# Patient Record
Sex: Male | Born: 1975 | Race: White | Hispanic: No | Marital: Married | Smoking: Never smoker
Health system: Southern US, Community
[De-identification: ages and names within clinical notes are randomized; demographics above are authoritative.]

## PROBLEM LIST (undated history)

## (undated) HISTORY — PX: SHOULDER SURGERY: SHX246

---

## 2003-04-10 ENCOUNTER — Emergency Department (HOSPITAL_COMMUNITY): Admission: EM | Admit: 2003-04-10 | Discharge: 2003-04-10 | Payer: Self-pay | Admitting: Emergency Medicine

## 2003-04-10 ENCOUNTER — Encounter: Admission: RE | Admit: 2003-04-10 | Discharge: 2003-04-10 | Payer: Self-pay | Admitting: Emergency Medicine

## 2005-12-16 ENCOUNTER — Encounter: Admission: RE | Admit: 2005-12-16 | Discharge: 2005-12-16 | Payer: Self-pay | Admitting: Surgery

## 2005-12-16 ENCOUNTER — Ambulatory Visit (HOSPITAL_COMMUNITY): Admission: RE | Admit: 2005-12-16 | Discharge: 2005-12-17 | Payer: Self-pay | Admitting: Surgery

## 2016-05-07 ENCOUNTER — Emergency Department (HOSPITAL_COMMUNITY): Payer: Commercial Managed Care - PPO

## 2016-05-07 ENCOUNTER — Emergency Department (HOSPITAL_COMMUNITY)
Admission: EM | Admit: 2016-05-07 | Discharge: 2016-05-07 | Disposition: A | Discharge status: Home / Self Care | Payer: Commercial Managed Care - PPO | Attending: Emergency Medicine | Admitting: Emergency Medicine

## 2016-05-07 ENCOUNTER — Encounter (HOSPITAL_COMMUNITY): Payer: Self-pay | Admitting: Emergency Medicine

## 2016-05-07 DIAGNOSIS — M5441 Lumbago with sciatica, right side: Secondary | ICD-10-CM | POA: Diagnosis not present

## 2016-05-07 DIAGNOSIS — M5126 Other intervertebral disc displacement, lumbar region: Secondary | ICD-10-CM | POA: Diagnosis not present

## 2016-05-07 DIAGNOSIS — M5442 Lumbago with sciatica, left side: Secondary | ICD-10-CM | POA: Insufficient documentation

## 2016-05-07 DIAGNOSIS — M545 Low back pain, unspecified: Secondary | ICD-10-CM

## 2016-05-07 DIAGNOSIS — M5432 Sciatica, left side: Secondary | ICD-10-CM

## 2016-05-07 DIAGNOSIS — M5431 Sciatica, right side: Secondary | ICD-10-CM

## 2016-05-07 LAB — URINALYSIS, ROUTINE W REFLEX MICROSCOPIC
BACTERIA UA: NONE SEEN
BILIRUBIN URINE: NEGATIVE
Glucose, UA: NEGATIVE mg/dL
HGB URINE DIPSTICK: NEGATIVE
KETONES UR: NEGATIVE mg/dL
LEUKOCYTES UA: NEGATIVE
NITRITE: NEGATIVE
PROTEIN: 30 mg/dL — AB
Specific Gravity, Urine: 1.019 (ref 1.005–1.030)
pH: 5 (ref 5.0–8.0)

## 2016-05-07 LAB — BASIC METABOLIC PANEL
Anion gap: 11 (ref 5–15)
BUN: 16 mg/dL (ref 6–20)
CO2: 26 mmol/L (ref 22–32)
Calcium: 9.2 mg/dL (ref 8.9–10.3)
Chloride: 102 mmol/L (ref 101–111)
Creatinine, Ser: 0.98 mg/dL (ref 0.61–1.24)
GFR calc Af Amer: >60 mL/min (ref >60)
Glucose, Bld: 105 mg/dL — ABNORMAL HIGH (ref 65–99)
POTASSIUM: 3 mmol/L — AB (ref 3.5–5.1)
SODIUM: 139 mmol/L (ref 135–145)

## 2016-05-07 LAB — CBC WITH DIFFERENTIAL/PLATELET
BASOS ABS: 0 10*3/uL (ref 0.0–0.1)
Basophils Relative: 0 %
EOS ABS: 0.1 10*3/uL (ref 0.0–0.7)
EOS PCT: 1 %
HCT: 43.2 % (ref 39.0–52.0)
Hemoglobin: 14.8 g/dL (ref 13.0–17.0)
LYMPHS ABS: 1 10*3/uL (ref 0.7–4.0)
Lymphocytes Relative: 18 %
MCH: 28.8 pg (ref 26.0–34.0)
MCHC: 34.3 g/dL (ref 30.0–36.0)
MCV: 84 fL (ref 78.0–100.0)
Monocytes Absolute: 0.4 10*3/uL (ref 0.1–1.0)
Monocytes Relative: 7 %
Neutro Abs: 4.3 10*3/uL (ref 1.7–7.7)
Neutrophils Relative %: 74 %
PLATELETS: 251 10*3/uL (ref 150–400)
RBC: 5.14 MIL/uL (ref 4.22–5.81)
RDW: 13 % (ref 11.5–15.5)
WBC: 5.8 10*3/uL (ref 4.0–10.5)

## 2016-05-07 MED ORDER — POTASSIUM CHLORIDE CRYS ER 20 MEQ PO TBCR
40.0000 meq | EXTENDED_RELEASE_TABLET | Freq: Once | ORAL | Status: AC
  Administered 2016-05-07: 40 meq via ORAL
  Filled 2016-05-07: qty 2

## 2016-05-07 MED ORDER — KETOROLAC TROMETHAMINE 15 MG/ML IJ SOLN
15.0000 mg | Freq: Once | INTRAMUSCULAR | Status: AC
  Administered 2016-05-07: 15 mg via INTRAVENOUS
  Filled 2016-05-07: qty 1

## 2016-05-07 MED ORDER — HYDROCODONE-ACETAMINOPHEN 5-325 MG PO TABS
2.0000 | ORAL_TABLET | Freq: Once | ORAL | Status: AC
  Administered 2016-05-07: 2 via ORAL
  Filled 2016-05-07: qty 2

## 2016-05-07 MED ORDER — IBUPROFEN 600 MG PO TABS
600.0000 mg | ORAL_TABLET | Freq: Three times a day (TID) | ORAL | 0 refills | Status: AC
Start: 2016-05-07 — End: 2016-05-12

## 2016-05-07 MED ORDER — CYCLOBENZAPRINE HCL 10 MG PO TABS: 10.0000 mg | ORAL_TABLET | Freq: Three times a day (TID) | ORAL | 0 refills | Status: DC | PRN

## 2016-05-07 MED ORDER — GADOBENATE DIMEGLUMINE 529 MG/ML IV SOLN
20.0000 mL | Freq: Once | INTRAVENOUS | Status: AC
  Administered 2016-05-07: 20 mL via INTRAVENOUS

## 2016-05-07 MED ORDER — RANITIDINE HCL 150 MG PO TABS: 150.0000 mg | ORAL_TABLET | Freq: Two times a day (BID) | ORAL | 0 refills | Status: DC

## 2016-05-07 MED ORDER — HYDROCODONE-ACETAMINOPHEN 5-325 MG PO TABS
1.0000 | ORAL_TABLET | ORAL | 0 refills | Status: DC | PRN
Start: 2016-05-07 — End: 2021-11-27

## 2016-05-07 MED ORDER — DEXAMETHASONE SODIUM PHOSPHATE 10 MG/ML IJ SOLN
10.0000 mg | Freq: Once | INTRAMUSCULAR | Status: DC
  Filled 2016-05-07: qty 1

## 2016-05-07 MED ORDER — DEXAMETHASONE 4 MG PO TABS
10.0000 mg | ORAL_TABLET | Freq: Once | ORAL | Status: AC
  Administered 2016-05-07: 10 mg via ORAL
  Filled 2016-05-07: qty 3

## 2016-05-07 NOTE — ED Notes (Signed)
ED Provider at bedside. 

## 2016-05-07 NOTE — ED Notes (Signed)
Patient transported to MRI 

## 2016-05-07 NOTE — Discharge Instructions (Signed)
-   Drink plenty of fluids - No heavy lifting > 10 pounds for at least the next 1 week - Once your pain improves, use gentle stretching to improve you pain and help reduce stiffness - Heating pads can also help reduce your pain

## 2016-05-07 NOTE — ED Provider Notes (Signed)
[facility]-EMERGENCY DEPT Provider Note   CSN: [id number] Arrival date & time: 05/07/16  1621     History   Chief Complaint Chief Complaint  Patient presents with  . Back Pain  . Leg Pain    HPI Cody Hess is a 41 y.o. male.  HPI   Very pleasant 41 year old male here with back pain. Patient recently was bit by a cat one week ago. He was placed on Augmentin briefly and had multiple episodes of nausea and vomiting. He then developed an aching, throbbing midline lower back pain with associated numbness sensation along the back of his legs bilaterally. He was switched to doxycycline with resolution of his vomiting but has since had persistent worsening pain. Describes the pain as an aching, throbbing, primarily midline but also bilateral paraspinal pain. The pain radiates to the back of his legs bilaterally when he ambulates. He has associated numbness sensation along the back of his legs. Denies any dysuria or urinary or bowel incontinence. Denies any weakness of his legs. Denies any fevers, chills, or night sweats.   History reviewed. No pertinent past medical history.  There are no active problems to display for this patient.   Past Surgical History:  Procedure Laterality Date  . SHOULDER SURGERY         Home Medications    Prior to Admission medications   Medication Sig Start Date End Date Taking? Authorizing Provider  carvedilol (COREG) 6.25 MG tablet Take 6.25 mg by mouth daily.   Yes Historical Provider, MD  citalopram (CELEXA) 10 MG tablet Take 10 mg by mouth daily.   Yes Historical Provider, MD  naproxen sodium (ANAPROX) 220 MG tablet Take 220 mg by mouth daily as needed. For pain   Yes Historical Provider, MD  rosuvastatin (CRESTOR) 10 MG tablet Take 10 mg by mouth daily.   Yes Historical Provider, MD  cyclobenzaprine (FLEXERIL) 10 MG tablet Take 1 tablet (10 mg total) by mouth 3 (three) times daily as needed for muscle spasms. 05/07/16   Shaune Pollack, MD    HYDROcodone-acetaminophen (NORCO/VICODIN) 5-325 MG tablet Take 1-2 tablets by mouth every 4 (four) hours as needed. 05/07/16   Shaune Pollack, MD  ibuprofen (ADVIL,MOTRIN) 600 MG tablet Take 1 tablet (600 mg total) by mouth 3 (three) times daily. 05/07/16 05/12/16  Shaune Pollack, MD  ranitidine (ZANTAC) 150 MG tablet Take 1 tablet (150 mg total) by mouth 2 (two) times daily. 05/07/16 05/12/16  Shaune Pollack, MD    Family History No family history on file.  Social History Social History  Substance Use Topics  . Smoking status: Never Smoker  . Smokeless tobacco: Never Used  . Alcohol use Yes     Allergies   Augmentin [amoxicillin-pot clavulanate]   Review of Systems Review of Systems  Constitutional: Positive for fatigue. Negative for chills and fever.  HENT: Negative for congestion and rhinorrhea.   Eyes: Negative for visual disturbance.  Respiratory: Negative for cough, shortness of breath and wheezing.   Cardiovascular: Negative for chest pain and leg swelling.  Gastrointestinal: Negative for abdominal pain, diarrhea, nausea and vomiting.  Genitourinary: Negative for dysuria and flank pain.  Musculoskeletal: Positive for back pain. Negative for neck pain and neck stiffness.  Skin: Negative for rash and wound.  Allergic/Immunologic: Negative for immunocompromised state.  Neurological: Positive for numbness. Negative for syncope, weakness and headaches.  All other systems reviewed and are negative.    Physical Exam Updated Vital Signs BP 131/88 (BP Location: Right Arm)  Pulse 64   Temp 98.1 F (36.7 C) (Oral)   Resp 16   Ht 6\' 2"  (1.88 m)   Wt 200 lb (90.7 kg)   SpO2 95%   BMI 25.68 kg/m   Physical Exam  Constitutional: He is oriented to person, place, and time. He appears well-developed and well-nourished. No distress.  HENT:  Head: Normocephalic and atraumatic.  Eyes: Conjunctivae are normal.  Neck: Neck supple.  Cardiovascular: Normal rate, regular rhythm  and normal heart sounds.  Exam reveals no friction rub.   No murmur heard. Pulmonary/Chest: Effort normal and breath sounds normal. No respiratory distress. He has no wheezes. He has no rales.  Abdominal: He exhibits no distension.  Musculoskeletal: He exhibits no edema.  Neurological: He is alert and oriented to person, place, and time. He exhibits normal muscle tone.  Skin: Skin is warm. Capillary refill takes less than 2 seconds.  Psychiatric: He has a normal mood and affect.  Nursing note and vitals reviewed.   Spine Exam: Inspection/Palpation: Diffuse, significant TTP throughout midline and paraspinal lumbar spine. No midline deformity or erythema. Strength: 5/5 throughout LE bilaterally (hip flexion/extension, adduction/abduction; knee flexion/extension; foot dorsiflexion/plantarflexion, inversion/eversion; great toe inversion) Sensation: Intact to light touch in proximal and distal LE bilaterally Reflexes: 2+ quadriceps and achilles reflexes   ED Treatments / Results  Labs (all labs ordered are listed, but only abnormal results are displayed) Labs Reviewed  URINALYSIS, ROUTINE W REFLEX MICROSCOPIC - Abnormal; Notable for the following:       Result Value   Protein, ur 30 (*)    Squamous Epithelial / LPF 0-5 (*)    All other components within normal limits  BASIC METABOLIC PANEL - Abnormal; Notable for the following:    Potassium 3.0 (*)    Glucose, Bld 105 (*)    All other components within normal limits  CBC WITH DIFFERENTIAL/PLATELET    EKG  EKG Interpretation None       Radiology Mr Lumbar Spine W Wo Contrast  Result Date: 05/07/2016 CLINICAL DATA:  Initial evaluation for acute lower back pain. EXAM: MRI LUMBAR SPINE WITHOUT AND WITH CONTRAST TECHNIQUE: Multiplanar and multiecho pulse sequences of the lumbar spine were obtained without and with intravenous contrast. CONTRAST:  20mL MULTIHANCE GADOBENATE DIMEGLUMINE 529 MG/ML IV SOLN COMPARISON:  None. FINDINGS:  Segmentation: Normal segmentation. Lowest well-formed disc is labeled the L5-S1 level. Alignment: Vertebral bodies are normally aligned with preservation of the normal lumbar lordosis. No listhesis. Vertebrae: Vertebral body heights are well maintained. No evidence for acute or chronic fracture. Few small benign hemangiomas noted. No worrisome osseous lesion. Signal intensity within the vertebral body bone marrow is normal. No abnormal marrow edema. No abnormal enhancement. Conus medullaris: Extends to the L1 level and appears normal. Paraspinal and other soft tissues: Paraspinous soft tissues within normal limits. Visualized visceral structures unremarkable. Disc levels: L1-2:  Unremarkable. L2-3:  Unremarkable. L3-4: No disc bulge or disc protrusion. Mild facet hypertrophy. No stenosis. L4-5: Mild circumferential disc bulge with disc desiccation. Mild facet and ligamentum flavum hypertrophy. No significant canal stenosis. No significant foraminal encroachment. L5-S1:  Normal disc.  Minimal facet degeneration.  No stenosis. IMPRESSION: 1. Mild degenerative disc bulging at L4-5 without significant stenosis. 2. Mild facet degeneration at L3-4 through L5-S1. 3. Otherwise normal MRI of the lumbar spine. Electronically Signed   By: Rise MuBenjamin  McClintock M.D.   On: 05/07/2016 20:03    Procedures Procedures (including critical care time)  Medications Ordered in ED Medications  ketorolac (  TORADOL) 15 MG/ML injection 15 mg (15 mg Intravenous Given 05/07/16 1839)  HYDROcodone-acetaminophen (NORCO/VICODIN) 5-325 MG per tablet 2 tablet (2 tablets Oral Given 05/07/16 1838)  gadobenate dimeglumine (MULTIHANCE) injection 20 mL (20 mLs Intravenous Contrast Given 05/07/16 1931)  potassium chloride SA (K-DUR,KLOR-CON) CR tablet 40 mEq (40 mEq Oral Given 05/07/16 2054)  dexamethasone (DECADRON) tablet 10 mg (10 mg Oral Given 05/07/16 2055)     Initial Impression / Assessment and Plan / ED Course  I have reviewed the triage  vital signs and the nursing notes.  Pertinent labs & imaging results that were available during my care of the patient were reviewed by me and considered in my medical decision making (see chart for details).     41 yo M with PMHx as above here with severe back pain in setting of recent cat bite, followed by emesis after initiating Augmentin. Primary suspicion is herniated disc/lumbar stain secondary to forceful vomiting from ABX-induced gastritis, although given his recent cat bite, reported marked erythema or finger, must consider transient seeding of L-spine with discitis/osteo given his o/w lack of chronic back pain. Given his significant TTP on exam and sx c/w new onset sciatica, I do feel MRI emergently indicated to r/o osteo, discitis.   Fortunately, MR negative for acute abnormality. He has mild disc bulge which likely explains his sx. Otherwise, he is well appearing. WBC normal. No signs of infectious etiology. Will tx supportively, advise stretching/return to function, and d/c home.  Final Clinical Impressions(s) / ED Diagnoses   Final diagnoses:  Lower back pain  Bilateral sciatica  Herniated lumbar disc without myelopathy    New Prescriptions Discharge Medication List as of 05/07/2016  8:33 PM    START taking these medications   Details  cyclobenzaprine (FLEXERIL) 10 MG tablet Take 1 tablet (10 mg total) by mouth 3 (three) times daily as needed for muscle spasms., Starting Thu 05/07/2016, Print    HYDROcodone-acetaminophen (NORCO/VICODIN) 5-325 MG tablet Take 1-2 tablets by mouth every 4 (four) hours as needed., Starting Thu 05/07/2016, Print    ibuprofen (ADVIL,MOTRIN) 600 MG tablet Take 1 tablet (600 mg total) by mouth 3 (three) times daily., Starting Thu 05/07/2016, Until Tue 05/12/2016, Print    ranitidine (ZANTAC) 150 MG tablet Take 1 tablet (150 mg total) by mouth 2 (two) times daily., Starting Thu 05/07/2016, Until Tue 05/12/2016, Print         Shaune Pollack,  MD 05/08/16 1327

## 2016-05-07 NOTE — ED Triage Notes (Signed)
Pt. Stated, I was taking antibiotics for a cat bite, when I took the antibiotic it made me throw up.and while I was throwing up I must of hurt my back, but when [facility] went to [facility] they told me to come here cause the pain is going down my legs like their asleep and concerned about a kidney infection.

## 2016-05-07 NOTE — ED Notes (Signed)
Pt still in MRI 

## 2017-12-01 ENCOUNTER — Ambulatory Visit: Payer: Self-pay | Admitting: Family Medicine

## 2017-12-01 VITALS — BP 110/85 | HR 67 | Temp 97.6°F | Resp 20 | Wt 211.8 lb

## 2017-12-01 DIAGNOSIS — J329 Chronic sinusitis, unspecified: Secondary | ICD-10-CM

## 2017-12-01 MED ORDER — IPRATROPIUM BROMIDE 0.06 % NA SOLN
2.0000 | Freq: Three times a day (TID) | NASAL | 0 refills | Status: DC
Start: 2017-12-01 — End: 2021-11-27

## 2017-12-01 MED ORDER — DOXYCYCLINE HYCLATE 100 MG PO TABS: 100.0000 mg | ORAL_TABLET | Freq: Two times a day (BID) | ORAL | 0 refills | Status: DC

## 2017-12-01 NOTE — Patient Instructions (Signed)

## 2017-12-01 NOTE — Progress Notes (Signed)
Cody Hess is a 42 y.o. male who presents today with concerns of sinus congestion and facial pain for the last 2 weeks. Patient report using multiple medications over the counter to treat his symptoms to include pseudoephedrine and nasal sprays without relief of symptoms. He denies fever or known sick contacts at home or at work.  Review of Systems  Constitutional: Negative for chills, fever and malaise/fatigue.  HENT: Positive for congestion and sinus pain. Negative for ear discharge, ear pain and sore throat.   Eyes: Negative.   Respiratory: Negative for cough, sputum production and shortness of breath.   Cardiovascular: Negative.  Negative for chest pain.  Gastrointestinal: Negative for abdominal pain, diarrhea, nausea and vomiting.  Genitourinary: Negative for dysuria, frequency, hematuria and urgency.  Musculoskeletal: Negative for myalgias.  Skin: Negative.   Neurological: Negative for headaches.  Endo/Heme/Allergies: Negative.   Psychiatric/Behavioral: Negative.     O: Vitals:   12/01/17 1826  BP: 110/85  Pulse: 67  Resp: 20  Temp: 97.6 F (36.4 C)  SpO2: 98%     Physical Exam  Constitutional: He is oriented to person, place, and time. Vital signs are normal. He appears well-developed and well-nourished. He is active.  Non-toxic appearance. He does not have a sickly appearance.  HENT:  Head: Normocephalic.  Right Ear: Hearing, external ear and ear canal normal. Tympanic membrane is erythematous. A middle ear effusion is present.  Left Ear: Hearing, external ear and ear canal normal. Tympanic membrane is erythematous. A middle ear effusion is present.  Nose: Right sinus exhibits maxillary sinus tenderness and frontal sinus tenderness. Left sinus exhibits maxillary sinus tenderness and frontal sinus tenderness.  Mouth/Throat: Uvula is midline. Posterior oropharyngeal erythema present.  Neck: Normal range of motion. Neck supple.  Cardiovascular: Normal rate, regular  rhythm, normal heart sounds and normal pulses.  Pulmonary/Chest: Effort normal and breath sounds normal.  Abdominal: Soft. Bowel sounds are normal.  Musculoskeletal: Normal range of motion.  Lymphadenopathy:       Head (right side): No submental and no submandibular adenopathy present.       Head (left side): No submental and no submandibular adenopathy present.    He has no cervical adenopathy.  Neurological: He is alert and oriented to person, place, and time.  Psychiatric: He has a normal mood and affect.  Vitals reviewed.  A: 1. Sinusitis, unspecified chronicity, unspecified location    P: Discussed exam findings, diagnosis etiology and medication use and indications reviewed with patient. Follow- Up and discharge instructions provided. No emergent/urgent issues found on exam.  Patient verbalized understanding of information provided and agrees with plan of care (POC), all questions answered.  1. Sinusitis, unspecified chronicity, unspecified location - ipratropium (ATROVENT) 0.06 % nasal spray; Place 2 sprays into both nostrils 3 (three) times daily. - doxycycline (VIBRA-TABS) 100 MG tablet; Take 1 tablet (100 mg total) by mouth 2 (two) times daily.

## 2018-08-18 IMAGING — MR MR LUMBAR SPINE WO/W CM
4 of 7 series · 19 of 48 positions shown · IV contrast (multihance)
Comparison: None.

CLINICAL DATA: Initial evaluation for acute lower back pain.

EXAM:
MRI LUMBAR SPINE WITHOUT AND WITH CONTRAST
TECHNIQUE: Multiplanar and multiecho pulse sequences of the lumbar spine were
obtained without and with intravenous contrast.
CONTRAST:  20mL MULTIHANCE GADOBENATE DIMEGLUMINE 529 MG/ML IV SOLN

[Series 2: T1 · sagittal · 4.0mm · 0.51mm/px · 3 of 13 slices shown (1 of 2)]
[im 1/13]
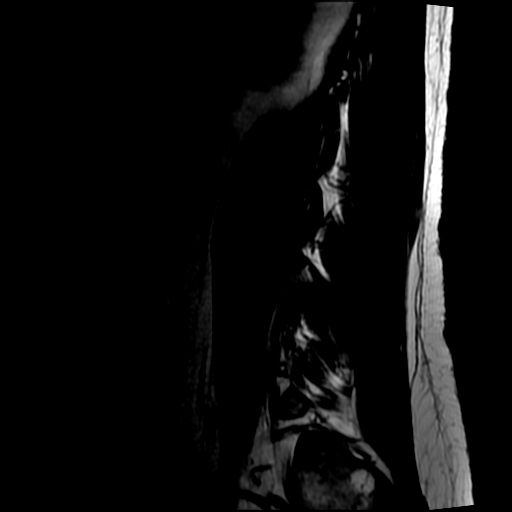
[im 7/13]
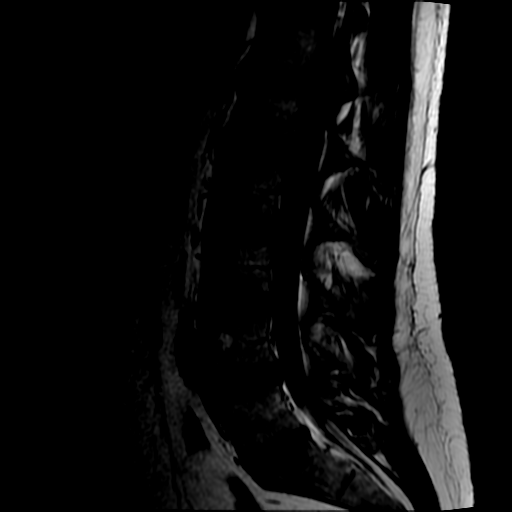
[im 13/13]
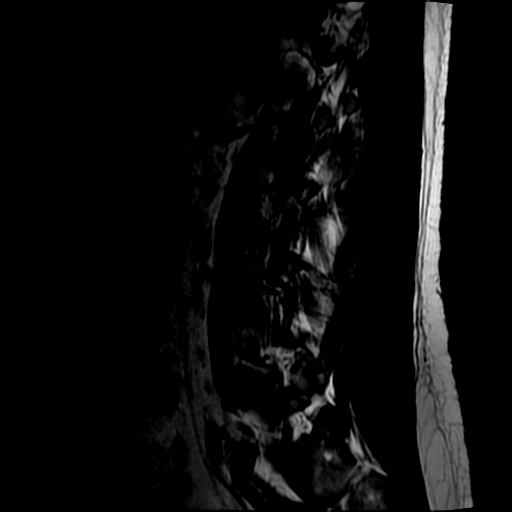

[Series 4: T2 · axial · 4.0mm · 0.39mm/px · z∈[-95,+88]mm · 10 of 37 slices shown (1 of 2)]
[im 1/37]
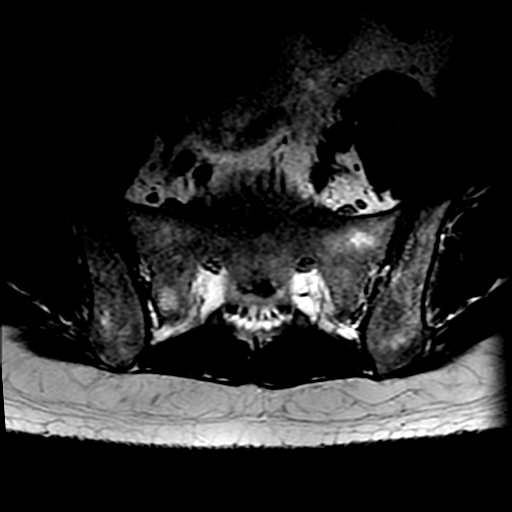
[im 4/37]
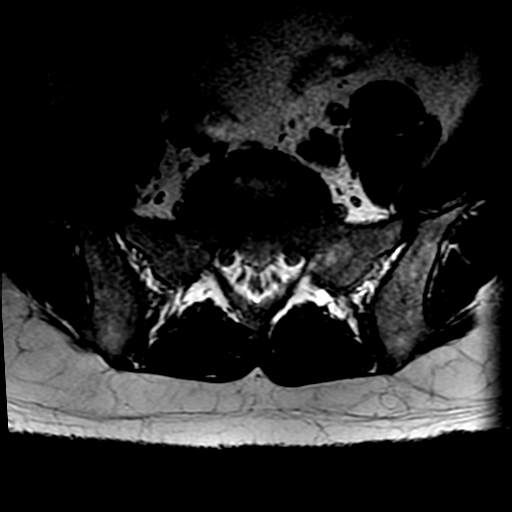
[im 8/37]
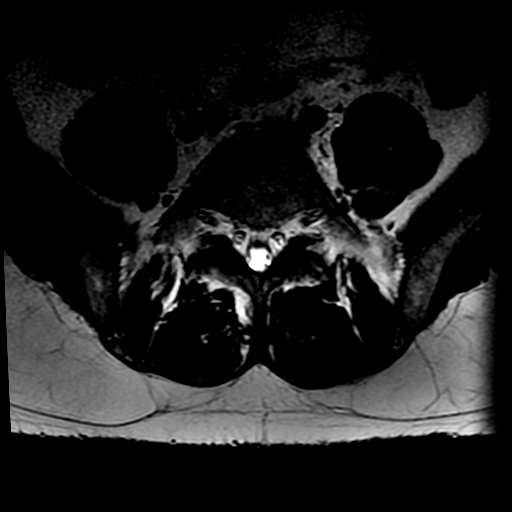
[im 11/37]
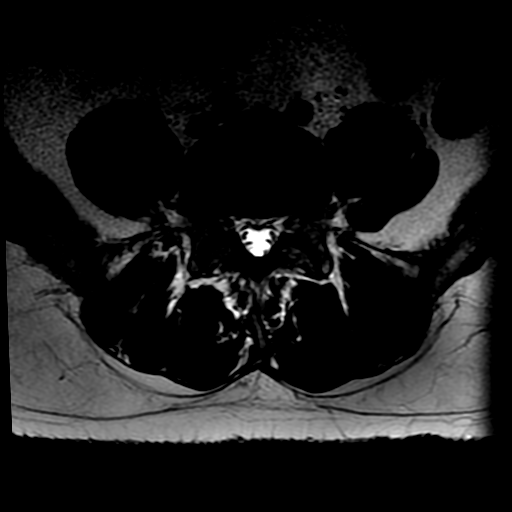
[im 15/37]
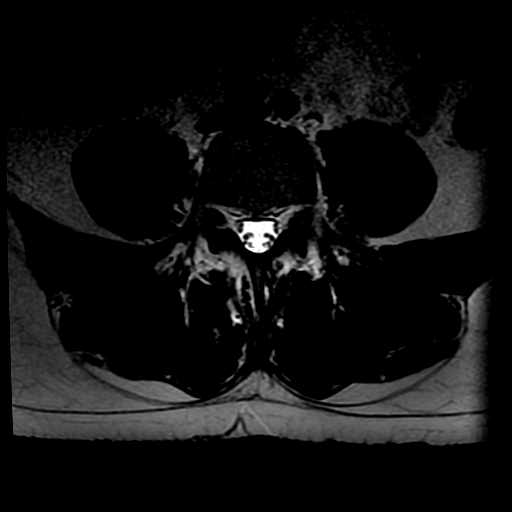
[im 19/37]
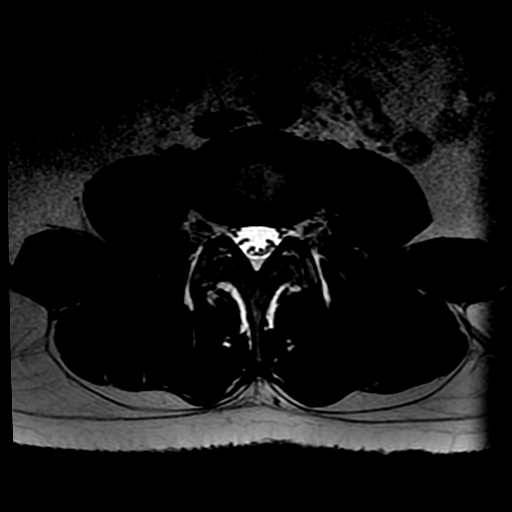
[im 22/37]
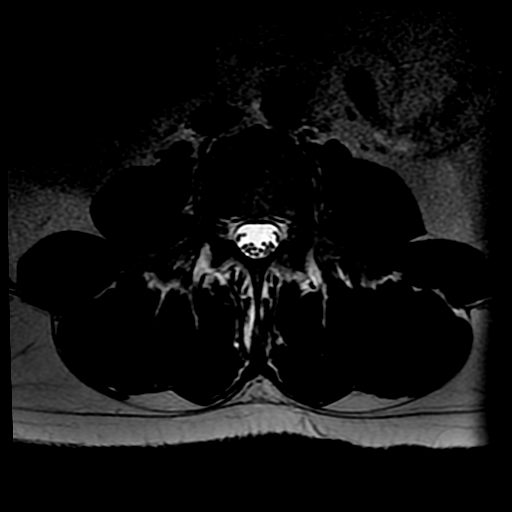
[im 26/37]
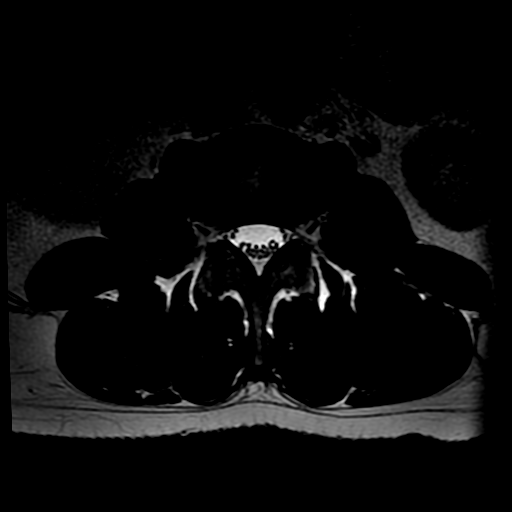
[im 29/37]
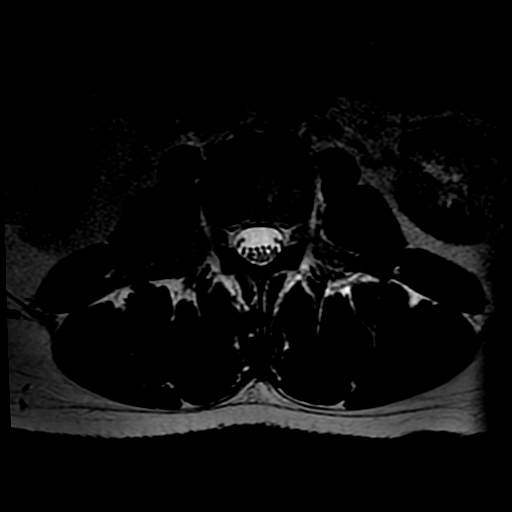
[im 33/37]
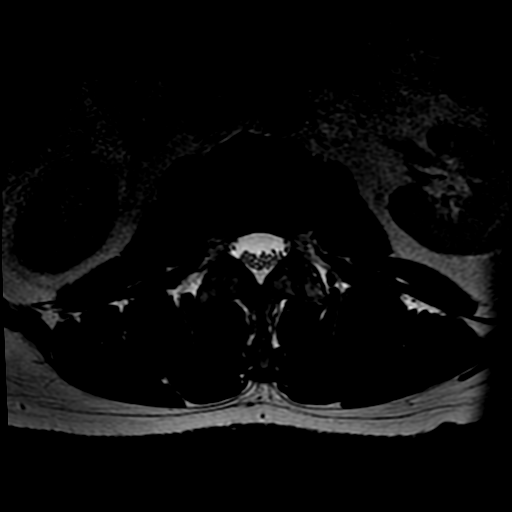

[Series 5: T1 · axial · 4.0mm · 0.39mm/px · z∈[-80,+88]mm · 3 of 37 slices shown (2 of 2)]
[im 4/37]
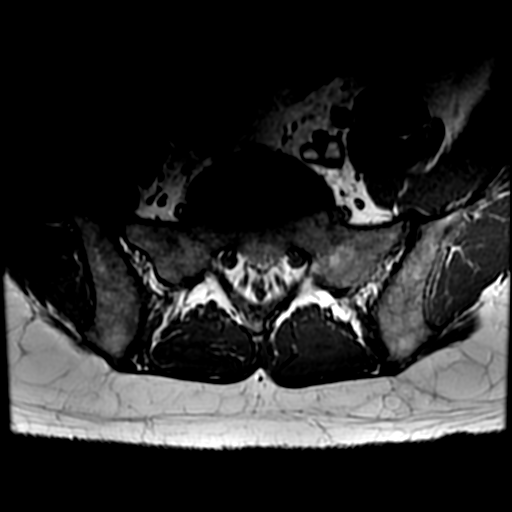
[im 19/37]
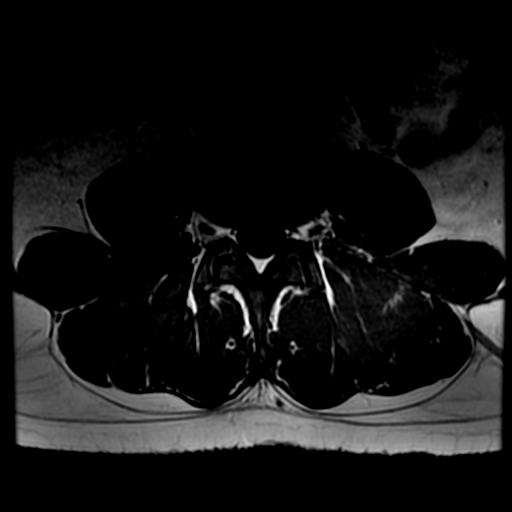
[im 33/37]
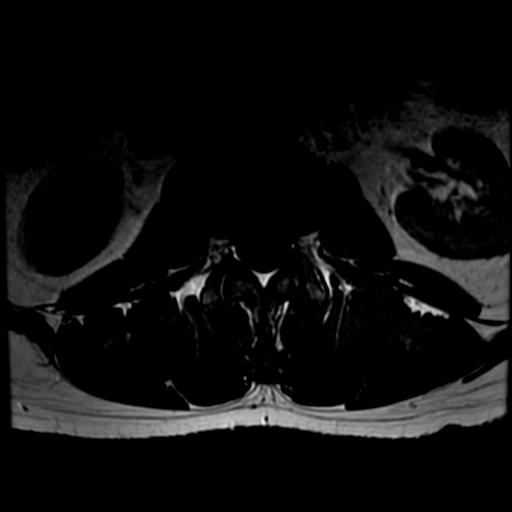

[Series 6: T2 · sagittal · 4.0mm · 0.51mm/px · 3 of 14 slices shown (2 of 2)]
[im 1/14]
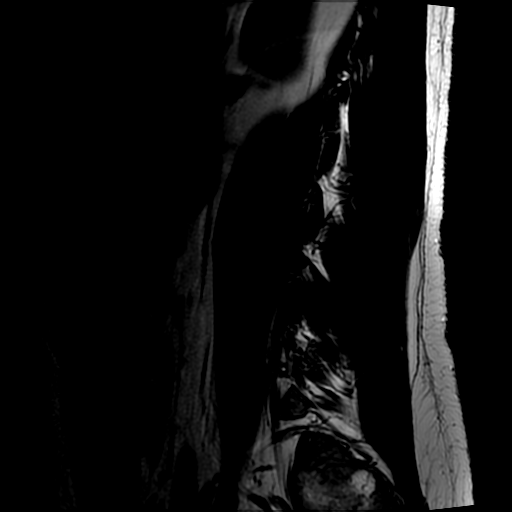
[im 9/14]
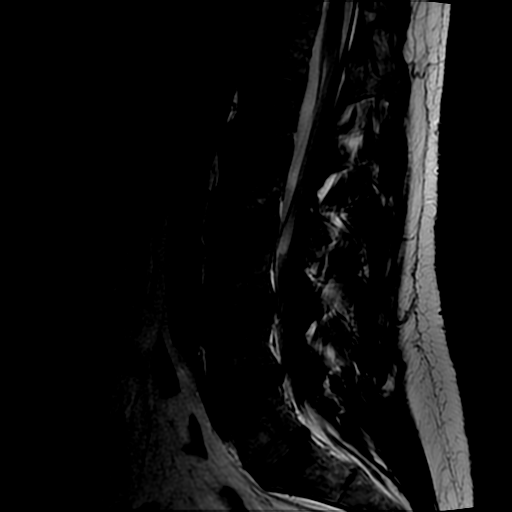
[im 14/14]
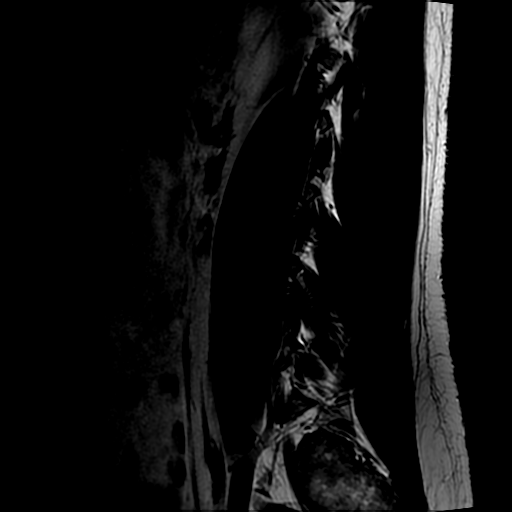

[19 of 48 positions shown; findings below may reference images not displayed]

FINDINGS: Segmentation: Normal segmentation. Lowest well-formed disc is
labeled the L5-S1 level.

Alignment: Vertebral bodies are normally aligned with preservation
of the normal lumbar lordosis. No listhesis.

Vertebrae: Vertebral body heights are well maintained. No evidence
for acute or chronic fracture. Few small benign hemangiomas noted.
No worrisome osseous lesion. Signal intensity within the vertebral
body bone marrow is normal. No abnormal marrow edema. No abnormal
enhancement.

Conus medullaris: Extends to the L1 level and appears normal.

Paraspinal and other soft tissues: Paraspinous soft tissues within
normal limits. Visualized visceral structures unremarkable.

Disc levels:

L1-2:  Unremarkable.

L2-3:  Unremarkable.

L3-4: No disc bulge or disc protrusion. Mild facet hypertrophy. No
stenosis.

L4-5: Mild circumferential disc bulge with disc desiccation. Mild
facet and ligamentum flavum hypertrophy. No significant canal
stenosis. No significant foraminal encroachment.

L5-S1:  Normal disc.  Minimal facet degeneration.  No stenosis.
IMPRESSION: 1. Mild degenerative disc bulging at L4-5 without significant
stenosis.
2. Mild facet degeneration at L3-4 through L5-S1.
3. Otherwise normal MRI of the lumbar spine.

## 2019-01-19 ENCOUNTER — Other Ambulatory Visit: Payer: Self-pay

## 2019-01-19 DIAGNOSIS — Z20822 Contact with and (suspected) exposure to covid-19: Secondary | ICD-10-CM

## 2019-01-22 LAB — NOVEL CORONAVIRUS, NAA: SARS-CoV-2, NAA: NOT DETECTED

## 2020-01-08 ENCOUNTER — Ambulatory Visit: Payer: Self-pay | Attending: Internal Medicine

## 2020-01-08 DIAGNOSIS — Z23 Encounter for immunization: Secondary | ICD-10-CM

## 2020-01-08 NOTE — Progress Notes (Signed)
   [facility]ov[facility] Vaccination [facility]  Name:  Cody Hess    MRN: [id number] DOB: 05/14/1975  01/08/2020  Mr. Cody Hess was observed post Covid-19 immunization for 30 minutes based on pre-vaccination screening without incident. He was provided with Vaccine Information Sheet and instruction to access the V-Safe system.   Mr. Cody Hess was instructed to call 911 with any severe reactions post vaccine: Marland Kitchen Difficulty breathing  . Swelling of face and throat  . A fast heartbeat  . A bad rash all over body  . Dizziness and weakness   Immunizations Administered    Name Date Dose VIS Date Route   JANSSEN COVID-19 VACCINE 01/08/2020  3:31 PM 0.5 mL 12/06/2019 Intramuscular   Manufacturer: [employer]   Lot: [id number]   NDC: 20947-096-28

## 2021-11-27 ENCOUNTER — Ambulatory Visit: Payer: Managed Care, Other (non HMO) | Attending: Cardiology | Admitting: Cardiology

## 2021-11-27 ENCOUNTER — Ambulatory Visit: Payer: Managed Care, Other (non HMO) | Attending: Cardiology

## 2021-11-27 ENCOUNTER — Encounter: Payer: Self-pay | Admitting: Cardiology

## 2021-11-27 VITALS — BP 140/100 | HR 75 | Ht 74.0 in | Wt 209.0 lb

## 2021-11-27 DIAGNOSIS — R002 Palpitations: Secondary | ICD-10-CM

## 2021-11-27 DIAGNOSIS — R072 Precordial pain: Secondary | ICD-10-CM | POA: Diagnosis not present

## 2021-11-27 MED ORDER — METOPROLOL TARTRATE 100 MG PO TABS: 100.0000 mg | ORAL_TABLET | ORAL | 0 refills | Status: AC

## 2021-11-27 NOTE — Progress Notes (Unsigned)
Enrolled for [employer] to mail a ZIO XT long term holter monitor to the patients address on file.  

## 2021-11-27 NOTE — Progress Notes (Signed)
Cardiology Office Note:    Date:  11/27/2021   ID:  Cody Hess, DOB 1975-04-20, MRN [id number]  PCP:  Mila Palmer, MD   [facility] Providers Cardiologist:  None     Referring MD: Mila Palmer, MD   History of Present Illness:    Cody Hess is a 46 y.o. male here for the evaluation of exertional chest pain at the request of Mila Palmer, [place], RN.  He was last seen on 11/19/2021 by Mila Palmer, [place], RN reporting recurrent exertional chest pain. Over the past 12 months, he has had about 5-6 episodes of extreme fatigue, shortness of breath, lightheadedness, and breaking out in sweats. The first episode was while he was mowing the lawn, and lasted for an hour. His severe fatigue and shortness of breath was very sudden. It was noted that he has hypertension, high cholesterol (on 10 mg Rosuvastatin), and a family history of cardiovascular disease. Given these risk factors along with the chest pain, he was referred to cardiology. He had an EKG which showed a heart rate of 64. During this visit he had a BP of 122/85 on Carvedilol.   Today, he presents with concerns for episodes of chest tightness, shortness of breath, fatigue, and lightheadedness. His first episode was about a year ago, while mowing the lawn. He experienced lightheadedness and "overwhelming fatigue," forcing him to stop and drink some water. Then he feel asleep for a time and woke up feeling better. He reports it took a couple weeks to recover from feeling lethargic and fatigued. Since this incident, he has experienced 6 more episodes sometimes even without exertion.   In the last week, his apple watch reported 4 episodes of atrial fibrillation. He checks his blood pressure often and reported that it usually is in the 120s/80s - 140s/100s. He confirms his BP is always higher at clinic visits.  His father had a history of undiagnosed hypertension and hyperlipidemia, and had a stroke at 32 yo. His grandmother also  had a history of cardiovascular disease.   Per his patient portal on his phone, he presented the results of his recent lab work: Hgb 15.5, platelets 311, creatinine 1.03, sodium 141, potassium 4.4, ALT 40, total cholesterol 236, HDL 40, LDL 156, triglycerides 217, TSH 1.93, and troponin was normal.  He denies any palpitations, or peripheral edema. No headaches, syncope, orthopnea, or PND.   History reviewed. No pertinent past medical history.  Past Surgical History:  Procedure Laterality Date   SHOULDER SURGERY      Current Medications: Current Meds  Medication Sig   carvedilol (COREG) 6.25 MG tablet Take 6.25 mg by mouth daily.   citalopram (CELEXA) 10 MG tablet Take 10 mg by mouth daily.   metoprolol tartrate (LOPRESSOR) 100 MG tablet Take 1 tablet (100 mg total) by mouth as directed. Take (1) tablet 2 hours before your CT scan   rosuvastatin (CRESTOR) 10 MG tablet Take 10 mg by mouth daily.     Allergies:   Augmentin [amoxicillin-pot clavulanate] and Penicillins   Social History   Socioeconomic History   Marital status: Married    Spouse name: Not on file   Number of children: Not on file   Years of education: Not on file   Highest education level: Not on file  Occupational History   Not on file  Tobacco Use   Smoking status: Never   Smokeless tobacco: Never  Substance and Sexual Activity   Alcohol use: Yes   Drug use:  No   Sexual activity: Not on file  Other Topics Concern   Not on file  Social History Narrative   Not on file   Social Determinants of Health   Financial Resource Strain: Not on file  Food Insecurity: Not on file  Transportation Needs: Not on file  Physical Activity: Not on file  Stress: Not on file  Social Connections: Not on file     Family History: The patient's family history is not on file.  ROS:   Please see the history of present illness.    (+) Chest tightness (+) Lightheadedness  (+) Shortness of breath (+)  Fatigue/Malaise All other systems reviewed and are negative.  EKGs/Labs/Other Studies Reviewed:    The following studies were reviewed today:  No prior cardiovascular studies available.   EKG:  EKG is personally reviewed and interpreted. 11/27/2021: Sinus rhythm. PACs. PVCs. Inferior infarct pattern.   Recent Labs: No results found for requested labs within last 365 days.   Recent Lipid Panel No results found for: "CHOL", "TRIG", "HDL", "CHOLHDL", "VLDL", "LDLCALC", "LDLDIRECT"   Risk Assessment/Calculations:          Physical Exam:    VS:  BP (!) 140/100 (BP Location: Left Arm, Patient Position: Sitting, Cuff Size: Normal)   Pulse 75   Ht 6\' 2"  (1.88 m)   Wt 209 lb (94.8 kg)   SpO2 97%   BMI 26.83 kg/m     Wt Readings from Last 3 Encounters:  11/27/21 209 lb (94.8 kg)  12/01/17 211 lb 12.8 oz (96.1 kg)  05/07/16 200 lb (90.7 kg)     GEN: Well nourished, well developed in no acute distress HEENT: Normal NECK: No JVD; No carotid bruits LYMPHATICS: No lymphadenopathy CARDIAC: RRR with occasional skipped beats, no murmurs, rubs, gallops RESPIRATORY:  Clear to auscultation without rales, wheezing or rhonchi  ABDOMEN: Soft, non-tender, non-distended MUSCULOSKELETAL:  No edema; No deformity  SKIN: Warm and dry NEUROLOGIC:  Alert and oriented x 3 PSYCHIATRIC:  Normal affect   ASSESSMENT:    1. Precordial pain   2. Palpitations    PLAN:    In order of problems listed above:  Precordial chest pain - Occasionally will have sensation of chest tightness with exertional type activity.  We will go ahead and check a coronary CT scan.  Prior to scan I will give him metoprolol to tartrate 100 mg one-time dose.  He did have a few ectopic beats on his EKG.  Hopefully this will help suppress so that we can obtain an adequate scan. -We will also check an echocardiogram to ensure proper structure and function of his heart.  Want to make sure that his description of transient  fatigue is not secondary to cardiomyopathy for potential valvular condition.  Palpitations - Watch has been notified him of potential atrial fibrillation. - We will go ahead and check a ZIO monitor.  PVCs or PACs are noted.  Hypertension - Has been treated since his 92s.  His father had a stroke in his 42s.  Blood pressure is elevated during today's visit and is often elevated within the doctor's office.  At home he will have variable measures sometimes 120/80 but sometimes as high as it is currently.  Continue to optimize good blood pressure control.  Exercise.  Salt reduction.  Hyperlipidemia - Currently on Crestor 10 mg a day.  Excellent.  LDL as above.  Reviewed from outside labs.  Recently had outside labs performed.  [facility].  Numbers as above.  Normal kidney function.  Follow-up: TBD based on results of testing.  Medication Adjustments/Labs and Tests Ordered: Current medicines are reviewed at length with the patient today.  Concerns regarding medicines are outlined above.   Orders Placed This Encounter  Procedures   CT CORONARY MORPH W/CTA COR W/SCORE W/CA W/CM &/OR WO/CM   LONG TERM MONITOR (3-14 DAYS)   EKG 12-Lead   ECHOCARDIOGRAM COMPLETE   Meds ordered this encounter  Medications   metoprolol tartrate (LOPRESSOR) 100 MG tablet    Sig: Take 1 tablet (100 mg total) by mouth as directed. Take (1) tablet 2 hours before your CT scan    Dispense:  1 tablet    Refill:  0   Patient Instructions  Medication Instructions:  The current medical regimen is effective;  continue present plan and medications.  *If you need a refill on your cardiac medications before your next appointment, please call your pharmacy*   Lab Work: None today (had recently at [facility]) If you have labs (blood work) drawn today and your tests are completely normal, you will receive your results only by: MyChart Message (if you have MyChart) OR A paper copy in the mail If you have any lab test that is  abnormal or we need to change your treatment, we will call you to review the results.   Testing/Procedures: Your physician has requested that you have an echocardiogram. Echocardiography is a painless test that uses sound waves to create images of your heart. It provides your doctor with information about the size and shape of your heart and how well your heart's chambers and valves are working. This procedure takes approximately one hour. There are no restrictions for this procedure.    Your cardiac CT will be scheduled at:   [facility] [address] [place], [place] [address] [phone]  Please arrive at the [facility] and Children's Entrance (Entrance C2) of [facility] 30 minutes prior to test start time. You can use the FREE valet parking offered at entrance C (encouraged to control the heart rate for the test)  Proceed to the [facility] Radiology Department (first floor) to check-in and test prep.  All radiology patients and guests should use entrance C2 at [facility], accessed from [facility], even though the hospital's physical address listed is [address]     Please follow these instructions carefully (unless otherwise directed):  Hold all erectile dysfunction medications at least 3 days (72 hrs) prior to test. (Ie viagra, cialis, sildenafil, tadalafil, etc) We will administer nitroglycerin during this exam.   On the Night Before the Test: Be sure to Drink plenty of water. Do not consume any caffeinated/decaffeinated beverages or chocolate 12 hours prior to your test. Do not take any antihistamines 12 hours prior to your test.  On the Day of the Test: Drink plenty of water until 1 hour prior to the test. Do not eat any food 1 hour prior to test. You may take your regular medications prior to the test.  Take metoprolol (Lopressor) two hours prior to test. HOLD Furosemide/Hydrochlorothiazide morning of the  test.  After the Test: Drink plenty of water. After receiving IV contrast, you may experience a mild flushed feeling. This is normal. On occasion, you may experience a mild rash up to 24 hours after the test. This is not dangerous. If this occurs, you can take Benadryl 25 mg and increase your fluid intake. If you experience trouble breathing, this can  be serious. If it is severe call 911 IMMEDIATELY. If it is mild, please call our office.  We will call to schedule your test 2-4 weeks out understanding that some insurance companies will need an authorization prior to the service being performed.   For non-scheduling related questions, please contact the cardiac imaging nurse navigator should you have any questions/concerns: Rockwell AlexandriaSara Wallace, Cardiac Imaging Nurse Navigator Larey BrickMerle Prescott, Cardiac Imaging Nurse Navigator [facility] Direct Office Dial: [phone]   For scheduling needs, including cancellations and rescheduling, please call [place], [phone].  ZIO XT- Long Term Monitor Instructions  Your physician has requested you wear a ZIO patch monitor for 14 days.  This is a single patch monitor. [employer] supplies one patch monitor per enrollment. Additional stickers are not available. Please do not apply patch if you will be having a Nuclear Stress Test,  Echocardiogram, Cardiac CT, MRI, or Chest Xray during the period you would be wearing the  monitor. The patch cannot be worn during these tests. You cannot remove and re-apply the  ZIO XT patch monitor.  Your ZIO patch monitor will be mailed 3 day USPS to your address on file. It may take 3-5 days  to receive your monitor after you have been enrolled.  Once you have received your monitor, please review the enclosed instructions. Your monitor  has already been registered assigning a specific monitor serial # to you.  Billing and Patient Assistance Program Information  We have supplied [employer] with any  of your insurance information on file for billing purposes. [employer] offers a sliding scale Patient Assistance Program for patients that do not have  insurance, or whose insurance does not completely cover the cost of the ZIO monitor.  You must apply for the Patient Assistance Program to qualify for this discounted rate.  To apply, please call [employer] at [phone], select option 4, select option 2, ask to apply for  Patient Assistance Program. Meredeth I[employer] will ask your household income, and how many people  are in your household. They will quote your out-of-pocket cost based on that information.  [employer] will also be able to set up a 5963-month, interest-free payment plan if needed.  Applying the monitor   Shave hair from upper left chest.  Hold abrader disc by orange tab. Rub abrader in 40 strokes over the upper left chest as  indicated in your monitor instructions.  Clean area with 4 enclosed alcohol pads. Let dry.  Apply patch as indicated in monitor instructions. Patch will be placed under collarbone on left  side of chest with arrow pointing upward.  Rub patch adhesive wings for 2 minutes. Remove white label marked "1". Remove the white  label marked "2". Rub patch adhesive wings for 2 additional minutes.  While looking in a mirror, press and release button in center of patch. A small green light will  flash 3-4 times. This will be your only indicator that the monitor has been turned on.  Do not shower for the first 24 hours. You may shower after the first 24 hours.  Press the button if you feel a symptom. You will hear a small click. Record Date, Time and  Symptom in the Patient Logbook.  When you are ready to remove the patch, follow instructions on the last 2 pages of Patient  Logbook. Stick patch monitor onto the last page of Patient Logbook.  Place Patient Logbook in the blue and white box. Use locking tab on box and tape box  closed  securely. The blue and white box has prepaid  postage on it. Please place it in the mailbox as  soon as possible. Your physician should have your test results approximately 7 days after the  monitor has been mailed back to [facility].  Call [facility] Customer Care at [phone] if you have questions regarding  your ZIO XT patch monitor. Call them immediately if you see an orange light blinking on your  monitor.  If your monitor falls off in less than 4 days, contact our Monitor department at [phone].  If your monitor becomes loose or falls off after 4 days call [employer] at [phone] for  suggestions on securing your monitor   Follow-Up: At [facility], you and your health needs are our priority.  As part of our continuing mission to provide you with exceptional heart care, we have created designated Provider Care Teams.  These Care Teams include your primary Cardiologist (physician) and Advanced Practice Providers (APPs -  Physician Assistants and Nurse Practitioners) who all work together to provide you with the care you need, when you need it.  We recommend signing up for the patient portal called "MyChart".  Sign up information is provided on this After Visit Summary.  MyChart is used to connect with patients for Virtual Visits (Telemedicine).  Patients are able to view lab/test results, encounter notes, upcoming appointments, etc.  Non-urgent messages can be sent to your provider as well.   To learn more about what you can do with MyChart, go to [email or web address].    Your next appointment:   Follow up will be based on the results of the above testing.   Important Information About Sugar         I,Rachel Rivera,acting as a scribe for Donato Schultz, MD.,have documented all relevant documentation on the behalf of Donato Schultz, MD,as directed by  Donato Schultz, MD while in the presence of Donato Schultz, MD.  I, Donato Schultz, MD, have reviewed all documentation for this visit. The documentation on  11/27/21 for the exam, diagnosis, procedures, and orders are all accurate and complete.   Signed, Donato Schultz, MD  11/27/2021 11:52 AM     [facility]

## 2021-11-27 NOTE — Patient Instructions (Signed)
Medication Instructions:  The current medical regimen is effective;  continue present plan and medications.  *If you need a refill on your cardiac medications before your next appointment, please call your pharmacy*   Lab Work: None today (had recently at [facility]) If you have labs (blood work) drawn today and your tests are completely normal, you will receive your results only by: [place] (if you have MyChart) OR A paper copy in the mail If you have any lab test that is abnormal or we need to change your treatment, we will call you to review the results.   Testing/Procedures: Your physician has requested that you have an echocardiogram. Echocardiography is a painless test that uses sound waves to create images of your heart. It provides your doctor with information about the size and shape of your heart and how well your heart's chambers and valves are working. This procedure takes approximately one hour. There are no restrictions for this procedure.    Your cardiac CT will be scheduled at:   [facility] [address] [place], [place] [address] [phone]  Please arrive at the [facility] and Children's Entrance (Entrance C2) of [facility] 30 minutes prior to test start time. You can use the FREE valet parking offered at entrance C (encouraged to control the heart rate for the test)  Proceed to the [facility] Radiology Department (first floor) to check-in and test prep.  All radiology patients and guests should use entrance C2 at [facility], accessed from [facility], even though the hospital's physical address listed is [address]     Please follow these instructions carefully (unless otherwise directed):  Hold all erectile dysfunction medications at least 3 days (72 hrs) prior to test. (Ie viagra, cialis, sildenafil, tadalafil, etc) We will administer nitroglycerin during this exam.   On the Night Before the  Test: Be sure to Drink plenty of water. Do not consume any caffeinated/decaffeinated beverages or chocolate 12 hours prior to your test. Do not take any antihistamines 12 hours prior to your test.  On the Day of the Test: Drink plenty of water until 1 hour prior to the test. Do not eat any food 1 hour prior to test. You may take your regular medications prior to the test.  Take metoprolol (Lopressor) two hours prior to test. HOLD Furosemide/Hydrochlorothiazide morning of the test.  After the Test: Drink plenty of water. After receiving IV contrast, you may experience a mild flushed feeling. This is normal. On occasion, you may experience a mild rash up to 24 hours after the test. This is not dangerous. If this occurs, you can take Benadryl 25 mg and increase your fluid intake. If you experience trouble breathing, this can be serious. If it is severe call 911 IMMEDIATELY. If it is mild, please call our office.  We will call to schedule your test 2-4 weeks out understanding that some insurance companies will need an authorization prior to the service being performed.   For non-scheduling related questions, please contact the cardiac imaging nurse navigator should you have any questions/concerns: Marchia Bond, Cardiac Imaging Nurse Navigator Gordy Clement, Cardiac Imaging Nurse Navigator [facility] Direct Office Dial: [phone]   For scheduling needs, including cancellations and rescheduling, please call [place], [phone].  ZIO XT- Long Term Monitor Instructions  Your physician has requested you wear a ZIO patch monitor for 14 days.  This is a single patch monitor. [employer] supplies one  patch monitor per enrollment. Additional stickers are not available. Please do not apply patch if you will be having a Nuclear Stress Test,  Echocardiogram, Cardiac CT, MRI, or Chest Xray during the period you would be wearing the  monitor. The patch cannot be worn  during these tests. You cannot remove and re-apply the  ZIO XT patch monitor.  Your ZIO patch monitor will be mailed 3 day USPS to your address on file. It may take 3-5 days  to receive your monitor after you have been enrolled.  Once you have received your monitor, please review the enclosed instructions. Your monitor  has already been registered assigning a specific monitor serial # to you.  Billing and Patient Assistance Program Information  We have supplied [employer] with any of your insurance information on file for billing purposes. [employer] offers a sliding scale Patient Assistance Program for patients that do not have  insurance, or whose insurance does not completely cover the cost of the ZIO monitor.  You must apply for the Patient Assistance Program to qualify for this discounted rate.  To apply, please call [employer] at [phone], select option 4, select option 2, ask to apply for  Patient Assistance Program. Theodore Demark will ask your household income, and how many people  are in your household. They will quote your out-of-pocket cost based on that information.  [employer] will also be able to set up a 67-month, interest-free payment plan if needed.  Applying the monitor   Shave hair from upper left chest.  Hold abrader disc by orange tab. Rub abrader in 40 strokes over the upper left chest as  indicated in your monitor instructions.  Clean area with 4 enclosed alcohol pads. Let dry.  Apply patch as indicated in monitor instructions. Patch will be placed under collarbone on left  side of chest with arrow pointing upward.  Rub patch adhesive wings for 2 minutes. Remove white label marked "1". Remove the white  label marked "2". Rub patch adhesive wings for 2 additional minutes.  While looking in a mirror, press and release button in center of patch. A small green light will  flash 3-4 times. This will be your only indicator that the monitor has been turned on.  Do not shower for the  first 24 hours. You may shower after the first 24 hours.  Press the button if you feel a symptom. You will hear a small click. Record Date, Time and  Symptom in the Patient Logbook.  When you are ready to remove the patch, follow instructions on the last 2 pages of Patient  Logbook. Stick patch monitor onto the last page of Patient Logbook.  Place Patient Logbook in the blue and white box. Use locking tab on box and tape box closed  securely. The blue and white box has prepaid postage on it. Please place it in the mailbox as  soon as possible. Your physician should have your test results approximately 7 days after the  monitor has been mailed back to [facility].  Call [place] at [phone] if you have questions regarding  your ZIO XT patch monitor. Call them immediately if you see an orange light blinking on your  monitor.  If your monitor falls off in less than 4 days, contact our Monitor department at [phone].  If your monitor becomes loose or falls off after 4 days call [employer] at [phone] for  suggestions on securing your monitor   Follow-Up: At [facility] you and  your health needs are our priority.  As part of our continuing mission to provide you with exceptional heart care, we have created designated Provider Care Teams.  These Care Teams include your primary Cardiologist (physician) and Advanced Practice Providers (APPs -  Physician Assistants and Nurse Practitioners) who all work together to provide you with the care you need, when you need it.  We recommend signing up for the patient portal called "MyChart".  Sign up information is provided on this After Visit Summary.  MyChart is used to connect with patients for Virtual Visits (Telemedicine).  Patients are able to view lab/test results, encounter notes, upcoming appointments, etc.  Non-urgent messages can be sent to your provider as well.   To learn more about what you can do with  MyChart, go to [email or web address].    Your next appointment:   Follow up will be based on the results of the above testing.   Important Information About Sugar

## 2021-12-09 ENCOUNTER — Ambulatory Visit (HOSPITAL_COMMUNITY): Payer: Managed Care, Other (non HMO) | Attending: Cardiology

## 2021-12-09 ENCOUNTER — Encounter: Payer: Self-pay | Admitting: Cardiology

## 2021-12-09 DIAGNOSIS — R072 Precordial pain: Secondary | ICD-10-CM | POA: Diagnosis present

## 2021-12-09 DIAGNOSIS — R002 Palpitations: Secondary | ICD-10-CM | POA: Insufficient documentation

## 2021-12-09 LAB — ECHOCARDIOGRAM COMPLETE
Area-P 1/2: 3.28 cm2
S' Lateral: 3.2 cm

## 2021-12-10 ENCOUNTER — Telehealth (HOSPITAL_COMMUNITY): Payer: Self-pay | Admitting: *Deleted

## 2021-12-10 NOTE — Telephone Encounter (Signed)
Attempted to call patient regarding upcoming cardiac CT appointment. °Left message on voicemail with name and callback number ° °Lukasz Rogus RN Nav[facility] Card[facility] °[facility] °[phone] Office °[phone] Cell ° °

## 2021-12-11 ENCOUNTER — Ambulatory Visit (HOSPITAL_COMMUNITY)
Admission: RE | Admit: 2021-12-11 | Discharge: 2021-12-11 | Disposition: A | Discharge status: Home / Self Care | Payer: Managed Care, Other (non HMO) | Source: Ambulatory Visit | Attending: Cardiology | Admitting: Cardiology

## 2021-12-11 DIAGNOSIS — R072 Precordial pain: Secondary | ICD-10-CM | POA: Diagnosis not present

## 2021-12-11 MED ORDER — IOHEXOL 350 MG/ML SOLN
95.0000 mL | Freq: Once | INTRAVENOUS | Status: AC | PRN
  Administered 2021-12-11: 95 mL via INTRAVENOUS

## 2021-12-11 MED ORDER — NITROGLYCERIN 0.4 MG SL SUBL
0.8000 mg | SUBLINGUAL_TABLET | Freq: Once | SUBLINGUAL | Status: AC
  Administered 2021-12-11: 0.8 mg via SUBLINGUAL

## 2021-12-11 MED ORDER — NITROGLYCERIN 0.4 MG SL SUBL
SUBLINGUAL_TABLET | SUBLINGUAL | Status: AC
  Filled 2021-12-11: qty 2

## 2021-12-16 ENCOUNTER — Ambulatory Visit: Payer: Self-pay | Admitting: Cardiology

## 2021-12-21 DIAGNOSIS — R002 Palpitations: Secondary | ICD-10-CM | POA: Diagnosis not present
